# Patient Record
Sex: Male | Born: 2000
Health system: Southern US, Community
[De-identification: ages and names within clinical notes are randomized; demographics above are authoritative.]

## PROBLEM LIST (undated history)

## (undated) DIAGNOSIS — F909 Attention-deficit hyperactivity disorder, unspecified type: Secondary | ICD-10-CM

## (undated) DIAGNOSIS — T7840XA Allergy, unspecified, initial encounter: Secondary | ICD-10-CM

## (undated) DIAGNOSIS — H539 Unspecified visual disturbance: Secondary | ICD-10-CM

## (undated) DIAGNOSIS — R112 Nausea with vomiting, unspecified: Secondary | ICD-10-CM

## (undated) HISTORY — PX: MULTIPLE TOOTH EXTRACTIONS: SHX2053

---
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
## 2019-06-30 IMAGING — CR DG ABDOMEN 1V
1 series · 1 of 1 positions shown · non-contrast
Comparison: None available

CLINICAL DATA: Persistent vomiting

EXAM:
ABDOMEN - 1 VIEW

[dg abd 1 view]
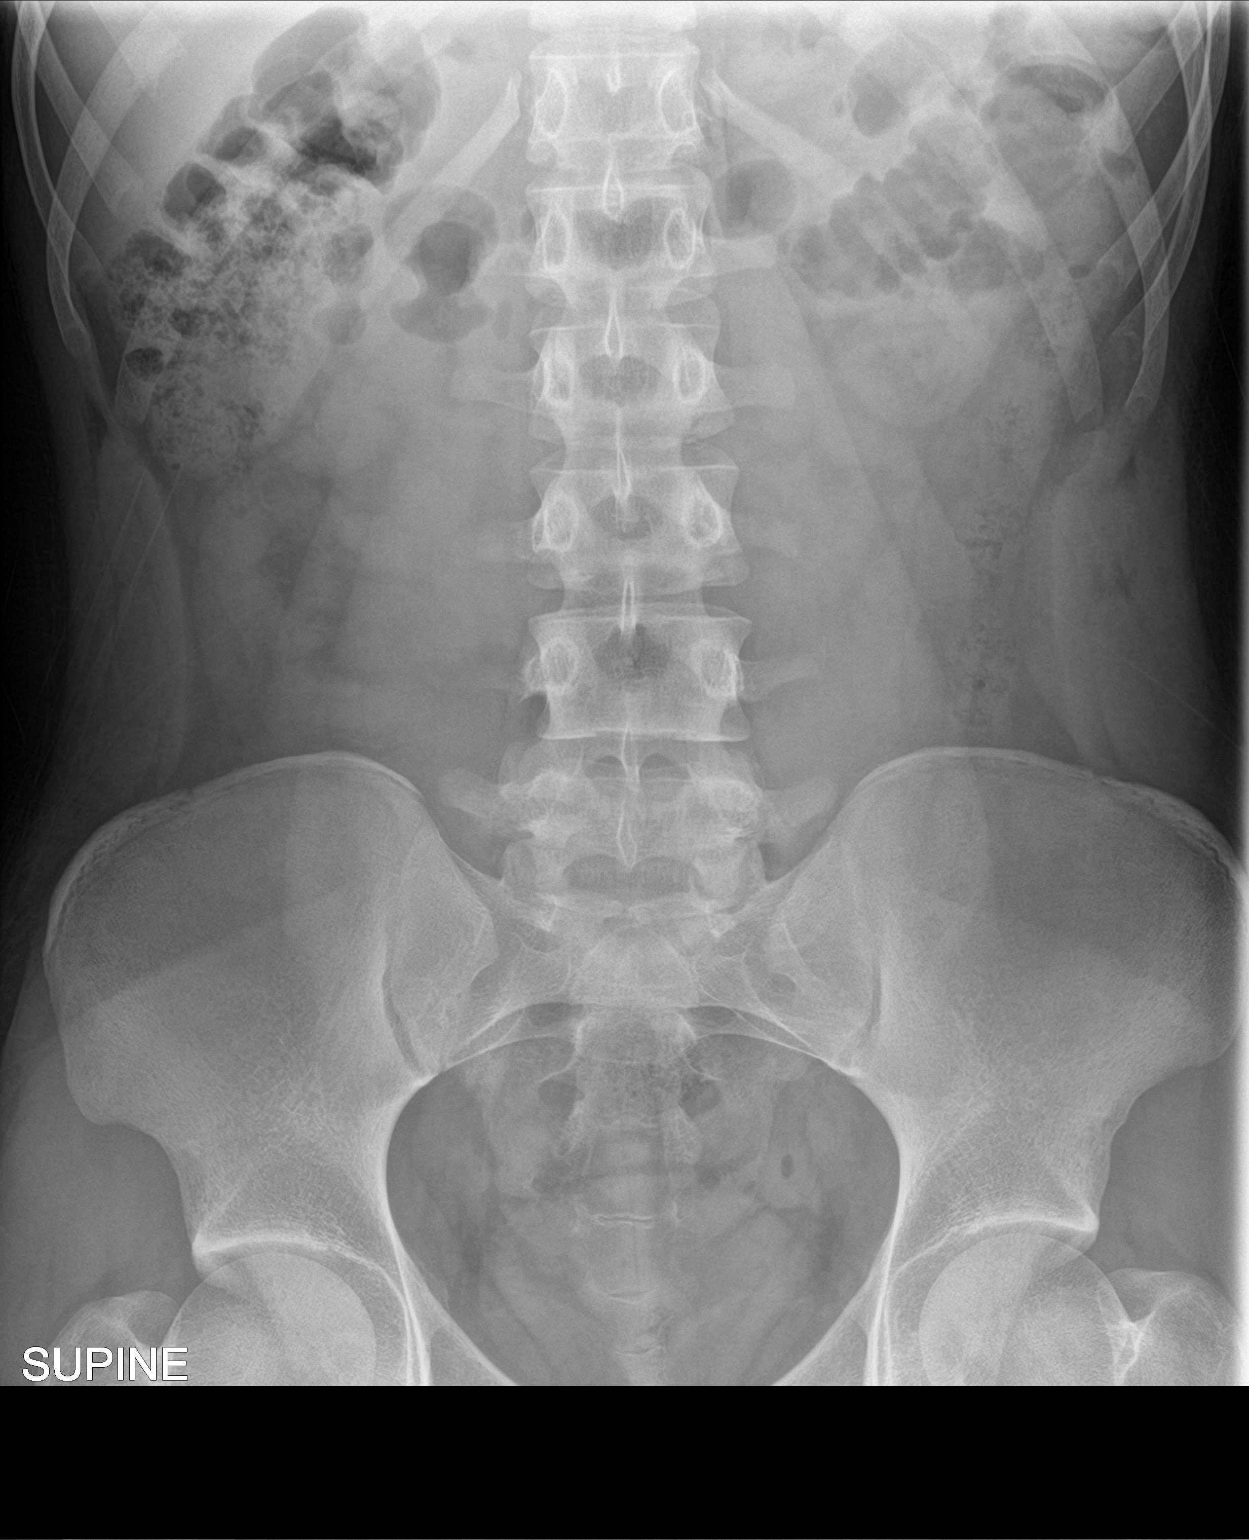

[1 of 1 positions shown; findings below may reference images not displayed]

FINDINGS: The bowel gas pattern is normal. No radio-opaque calculi or other
significant radiographic abnormality are seen.
IMPRESSION: Negative.

## 2019-09-15 ENCOUNTER — Other Ambulatory Visit: Payer: Self-pay

## 2019-09-15 DIAGNOSIS — Z20822 Contact with and (suspected) exposure to covid-19: Secondary | ICD-10-CM

## 2019-09-17 LAB — NOVEL CORONAVIRUS, NAA: SARS-CoV-2, NAA: NOT DETECTED

## 2021-04-05 ENCOUNTER — Ambulatory Visit: Payer: Self-pay

## 2021-04-07 ENCOUNTER — Ambulatory Visit: Payer: Self-pay

## 2021-04-12 ENCOUNTER — Ambulatory Visit: Payer: No Typology Code available for payment source | Attending: Family Medicine

## 2021-04-12 ENCOUNTER — Other Ambulatory Visit: Payer: Self-pay | Admitting: Family Medicine

## 2021-04-12 DIAGNOSIS — N50811 Right testicular pain: Secondary | ICD-10-CM
